# Patient Record
Sex: Male | Born: 1990 | Race: Black or African American | Hispanic: No | Marital: Single | Smoking: Never smoker
Health system: Southern US, Community
[De-identification: ages and names within clinical notes are randomized; demographics above are authoritative.]

## PROBLEM LIST (undated history)

## (undated) DIAGNOSIS — K644 Residual hemorrhoidal skin tags: Secondary | ICD-10-CM

---
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
## 2011-10-11 ENCOUNTER — Encounter (HOSPITAL_COMMUNITY): Payer: Self-pay | Admitting: *Deleted

## 2011-10-11 ENCOUNTER — Emergency Department (INDEPENDENT_AMBULATORY_CARE_PROVIDER_SITE_OTHER)
Admission: EM | Admit: 2011-10-11 | Discharge: 2011-10-11 | Disposition: A | Discharge status: Home / Self Care | Source: Home / Self Care | Attending: Family Medicine | Admitting: Family Medicine

## 2011-10-11 DIAGNOSIS — H00019 Hordeolum externum unspecified eye, unspecified eyelid: Secondary | ICD-10-CM

## 2011-10-11 MED ORDER — TOBRAMYCIN 0.3 % OP SOLN: 1.0000 [drp] | Freq: Three times a day (TID) | OPHTHALMIC | Status: DC

## 2011-10-11 NOTE — ED Provider Notes (Signed)
History     CSN: [id number]  Arrival date & time 10/11/11  1745   First MD Initiated Contact with Patient 10/11/11 1805      Chief Complaint  Patient presents with  . Eye Pain    (Consider location/radiation/quality/duration/timing/severity/associated sxs/prior treatment) Patient is a 21 y.o. male presenting with eye pain. The history is provided by the patient.  Eye Pain This is a new problem. The current episode started more than 2 days ago. The problem has been gradually worsening.    History reviewed. No pertinent past medical history.  History reviewed. No pertinent past surgical history.  History reviewed. No pertinent family history.  History  Substance Use Topics  . Smoking status: Not on file  . Smokeless tobacco: Not on file  . Alcohol Use: No      Review of Systems  Constitutional: Negative.   HENT: Negative.   Eyes: Positive for pain. Negative for photophobia, discharge, redness, itching and visual disturbance.    Allergies  Review of patient's allergies indicates no known allergies.  Home Medications   Current Outpatient Rx  Name Route Sig Dispense Refill  . TOBRAMYCIN SULFATE 0.3 % OP SOLN Right Eye Place 1 drop into the right eye 3 (three) times daily. 5 mL 0    BP 114/69  Pulse 80  Temp 99 F (37.2 C) (Oral)  Resp 18  SpO2 100%  Physical Exam  Nursing note and vitals reviewed. Constitutional: He appears well-developed and well-nourished.  HENT:  Head: Normocephalic.  Right Ear: External ear normal.  Mouth/Throat: Oropharynx is clear and moist.  Eyes: Conjunctivae normal and EOM are normal. Pupils are equal, round, and reactive to light. No foreign bodies found. Right eye exhibits hordeolum. Right eye exhibits no discharge and no exudate. No foreign body present in the right eye. No scleral icterus.    ED Course  Procedures (including critical care time)  Labs Reviewed - No data to display No results found.   1. Stye        MDM          Linna Hoff, MD 10/11/11 [phone]

## 2011-10-11 NOTE — ED Notes (Signed)
Pt reports bump on upper lid of right eye

## 2012-04-13 ENCOUNTER — Encounter (HOSPITAL_COMMUNITY): Payer: Self-pay | Admitting: Emergency Medicine

## 2012-04-13 ENCOUNTER — Emergency Department (INDEPENDENT_AMBULATORY_CARE_PROVIDER_SITE_OTHER)
Admission: EM | Admit: 2012-04-13 | Discharge: 2012-04-13 | Disposition: A | Discharge status: Home / Self Care | Payer: BC Managed Care – PPO | Source: Home / Self Care | Attending: Family Medicine | Admitting: Family Medicine

## 2012-04-13 DIAGNOSIS — K6289 Other specified diseases of anus and rectum: Secondary | ICD-10-CM

## 2012-04-13 HISTORY — DX: Residual hemorrhoidal skin tags: K64.4

## 2012-04-13 NOTE — ED Notes (Signed)
Pt c/o external hemorrhoids onset yest Hx of external hemorrhoids Pain is intermittent Needing a note for [employer] stating no strenuous activity Sx include: pain, blood in the stools  He is alert and oriented w/no signs of acute distress.

## 2012-04-13 NOTE — ED Provider Notes (Signed)
History     CSN: [id number]  Arrival date & time 04/13/12  1629   First MD Initiated Contact with Patient 04/13/12 1653      Chief Complaint  Patient presents with  . Hemorrhoids    (Consider location/radiation/quality/duration/timing/severity/associated sxs/prior treatment) Patient is a 22 y.o. male presenting with hematochezia. The history is provided by the patient.  Rectal Bleeding  The current episode started more than 2 weeks ago. The onset was gradual. The problem has been unchanged. The pain is mild. The stool is described as streaked with blood. Associated symptoms include hemorrhoids and rectal pain. Pertinent negatives include no diarrhea.    Past Medical History  Diagnosis Date  . External hemorrhoid     History reviewed. No pertinent past surgical history.  No family history on file.  History  Substance Use Topics  . Smoking status: Not on file  . Smokeless tobacco: Not on file  . Alcohol Use: No      Review of Systems  Constitutional: Negative.   Gastrointestinal: Positive for hematochezia, rectal pain and hemorrhoids. Negative for diarrhea and constipation.  Genitourinary: Negative.     Allergies  Review of patient's allergies indicates no known allergies.  Home Medications   Current Outpatient Rx  Name  Route  Sig  Dispense  Refill  . tobramycin (TOBREX) 0.3 % ophthalmic solution   Right Eye   Place 1 drop into the right eye 3 (three) times daily.   5 mL   0     BP 120/61  Pulse 74  Temp(Src) 98.3 F (36.8 C) (Oral)  Resp 14  SpO2 98%  Physical Exam  Nursing note and vitals reviewed. Constitutional: He appears well-developed and well-nourished.  Abdominal: Soft. Bowel sounds are normal. He exhibits no distension and no mass. There is no tenderness. There is no rebound and no guarding.  Genitourinary: Rectum normal and prostate normal. Rectal exam shows no external hemorrhoid, no internal hemorrhoid, no fissure, no mass and no  tenderness.  Skin: Skin is warm and dry.    ED Course  Procedures (including critical care time)  Labs Reviewed - No data to display No results found.   1. Anal or rectal pain       MDM          Linna Hoff, MD 04/13/12 1727

## 2014-08-25 ENCOUNTER — Ambulatory Visit (INDEPENDENT_AMBULATORY_CARE_PROVIDER_SITE_OTHER): Payer: BLUE CROSS/BLUE SHIELD | Admitting: Family Medicine

## 2014-08-25 VITALS — BP 110/62 | HR 80 | Temp 98.4°F | Ht 69.0 in | Wt 186.4 lb

## 2014-08-25 DIAGNOSIS — Z0000 Encounter for general adult medical examination without abnormal findings: Secondary | ICD-10-CM | POA: Diagnosis not present

## 2014-08-25 LAB — CBC
HCT: 42.4 % (ref 39.0–52.0)
HEMOGLOBIN: 14.9 g/dL (ref 13.0–17.0)
MCH: 30.5 pg (ref 26.0–34.0)
MCHC: 35.1 g/dL (ref 30.0–36.0)
MCV: 86.9 fL (ref 78.0–100.0)
MPV: 10.1 fL (ref 8.6–12.4)
Platelets: 269 10*3/uL (ref 150–400)
RBC: 4.88 MIL/uL (ref 4.22–5.81)
RDW: 13.5 % (ref 11.5–15.5)
WBC: 6.7 10*3/uL (ref 4.0–10.5)

## 2014-08-25 LAB — COMPREHENSIVE METABOLIC PANEL
ALBUMIN: 4.5 g/dL (ref 3.6–5.1)
ALT: 20 U/L (ref 9–46)
AST: 23 U/L (ref 10–40)
Alkaline Phosphatase: 51 U/L (ref 40–115)
BUN: 13 mg/dL (ref 7–25)
CO2: 25 mmol/L (ref 20–31)
Calcium: 10 mg/dL (ref 8.6–10.3)
Chloride: 103 mmol/L (ref 98–110)
Creat: 1.09 mg/dL (ref 0.60–1.35)
Glucose, Bld: 76 mg/dL (ref 65–99)
Potassium: 4.6 mmol/L (ref 3.5–5.3)
SODIUM: 140 mmol/L (ref 135–146)
TOTAL PROTEIN: 7.5 g/dL (ref 6.1–8.1)
Total Bilirubin: 0.6 mg/dL (ref 0.2–1.2)

## 2014-08-25 LAB — LIPID PANEL
CHOL/HDL RATIO: 3.6 ratio (ref <=5.0)
Cholesterol: 183 mg/dL (ref 125–200)
HDL: 51 mg/dL (ref >=40)
LDL Cholesterol: 120 mg/dL (ref <130)
Triglycerides: 61 mg/dL (ref <150)
VLDL: 12 mg/dL (ref <30)

## 2014-08-25 NOTE — Patient Instructions (Signed)
Normal physical examination.  I will let you know the results of your labs in a few days.  Return at any time if needed.  Return in one to 2 years for a follow-up physical.

## 2014-08-25 NOTE — Progress Notes (Signed)
Annual physical examination:  History: Patient is here for a physical exam. He is joining the medicines and they requested he get a physical exam. He does not have any form from them.  Past medical history: Medications: None Allergies: Seasonal, no medication allergies Past medical history: Unremarkable Past surgical history: Unremarkable  Family history: Mother is living and well. Father is living and well. He has one brother and one sister. There are also healthy. No major familial diseases.  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 He has college education in music and in criminal justice. He is planning to go back for further training when he works out things with the [place]. He is single, has had 2 sexual partners within the past year. He is active at his church where he plays the drums. He does not smoke, drink, or use drugs.  Review of systems: Constitutional: Unremarkable HEENT: Unremarkable Vascular: Unremarkable Respiratory: Unremarkable Gastrointestinal: Unremarkable Genitourinary: Unremarkable Musculoskeletal: Unremarkable Neurologic: Unremarkable Psychiatric: Unremarkable Dermatologic: Unremarkable Endocrinologic: Unremarkable   Physical exam: Well-developed well-nourished young man in no major distress, fully alert and oriented. His TMs are normal. Eyes PERRLA. Fundi benign. Throat clear. Teeth good. Neck supple without nodes or thyromegaly. No carotid bruits. Chest is clear to auscultation. Heart regular without murmurs gallops or arrhythmias. Abdomen was soft without masses or tenderness. No axillary or inguinal nodes. Normal male external genitalia with testes descended. No hernias. Digital rectal exam not done at this age. Extremities unremarkable. Skin unremarkable. Pulses in feet good.  Assessment: Normal physical examination  Plan: Advise healthy  lifestyle choices Return every 1-2 years or as needed CBC, CMP, lipids

## 2014-08-27 ENCOUNTER — Encounter: Payer: Self-pay | Admitting: Family Medicine

## 2014-12-14 ENCOUNTER — Emergency Department (INDEPENDENT_AMBULATORY_CARE_PROVIDER_SITE_OTHER)
Admission: EM | Admit: 2014-12-14 | Discharge: 2014-12-14 | Disposition: A | Discharge status: Home / Self Care | Payer: BLUE CROSS/BLUE SHIELD | Source: Home / Self Care

## 2014-12-14 ENCOUNTER — Encounter (HOSPITAL_COMMUNITY): Payer: Self-pay | Admitting: Emergency Medicine

## 2014-12-14 DIAGNOSIS — J069 Acute upper respiratory infection, unspecified: Secondary | ICD-10-CM | POA: Diagnosis not present

## 2014-12-14 DIAGNOSIS — R197 Diarrhea, unspecified: Secondary | ICD-10-CM

## 2014-12-14 MED ORDER — IBUPROFEN 800 MG PO TABS
800.0000 mg | ORAL_TABLET | Freq: Once | ORAL | Status: AC
  Administered 2014-12-14: 800 mg via ORAL

## 2014-12-14 MED ORDER — AMOXICILLIN 500 MG PO CAPS: 500.0000 mg | ORAL_CAPSULE | Freq: Three times a day (TID) | ORAL | Status: DC

## 2014-12-14 MED ORDER — IBUPROFEN 800 MG PO TABS
ORAL_TABLET | ORAL | Status: AC
  Filled 2014-12-14: qty 1

## 2014-12-14 NOTE — Discharge Instructions (Signed)
Diarrhea Diarrhea is watery poop (stool). It can make you feel weak, tired, thirsty, or give you a dry mouth (signs of dehydration). Watery poop is a sign of another problem, most often an infection. It often lasts 2-3 days. It can last longer if it is a sign of something serious. Take care of yourself as told by your doctor. HOME CARE   Drink 1 cup (8 ounces) of fluid each time you have watery poop.  Do not drink the following fluids:  Those that contain simple sugars (fructose, glucose, galactose, lactose, sucrose, maltose).  Sports drinks.  Fruit juices.  Whole milk products.  Sodas.  Drinks with caffeine (coffee, tea, soda) or alcohol.  Oral rehydration solution may be used if the doctor says it is okay. You may make your own solution. Follow this recipe:   - teaspoon table salt.   teaspoon baking soda.   teaspoon salt substitute containing potassium chloride.  1 tablespoons sugar.  1 liter (34 ounces) of water.  Avoid the following foods:  High fiber foods, such as raw fruits and vegetables.  Nuts, seeds, and whole grain breads and cereals.   Those that are sweetened with sugar alcohols (xylitol, sorbitol, mannitol).  Try eating the following foods:  Starchy foods, such as rice, toast, pasta, low-sugar cereal, oatmeal, baked potatoes, crackers, and bagels.  Bananas.  Applesauce.  Eat probiotic-rich foods, such as yogurt and milk products that are fermented.  Wash your hands well after each time you have watery poop.  Only take medicine as told by your doctor.  Take a warm bath to help lessen burning or pain from having watery poop. GET HELP RIGHT AWAY IF:   You cannot drink fluids without throwing up (vomiting).  You keep throwing up.  You have blood in your poop, or your poop looks black and tarry.  You do not pee (urinate) in 6-8 hours, or there is only a small amount of very dark pee.  You have belly (abdominal) pain that gets worse or stays  in the same spot (localizes).  You are weak, dizzy, confused, or light-headed.  You have a very bad headache.  Your watery poop gets worse or does not get better.  You have a fever or lasting symptoms for more than 2-3 days.  You have a fever and your symptoms suddenly get worse. MAKE SURE YOU:   Understand these instructions.  Will watch your condition.  Will get help right away if you are not doing well or get worse.   This information is not intended to replace advice given to you by your health care provider. Make sure you discuss any questions you have with your health care provider.   Document Released: 06/17/2007 Document Revised: 01/19/2014 Document Reviewed: 09/06/2011 Elsevier Interactive Patient Education 2016 Elsevier Inc. Upper Respiratory Infection, Adult Most upper respiratory infections (URIs) are caused by a virus. A URI affects the nose, throat, and upper air passages. The most common type of URI is often called "the common cold." HOME CARE   Take medicines only as told by your doctor.  Gargle warm saltwater or take cough drops to comfort your throat as told by your doctor.  Use a warm mist humidifier or inhale steam from a shower to increase air moisture. This may make it easier to breathe.  Drink enough fluid to keep your pee (urine) clear or pale yellow.  Eat soups and other clear broths.  Have a healthy diet.  Rest as needed.  Go back  to work when your fever is gone or your doctor says it is okay.  You may need to stay home longer to avoid giving your URI to others.  You can also wear a face mask and wash your hands often to prevent spread of the virus.  Use your inhaler more if you have asthma.  Do not use any tobacco products, including cigarettes, chewing tobacco, or electronic cigarettes. If you need help quitting, ask your doctor. GET HELP IF:  You are getting worse, not better.  Your symptoms are not helped by medicine.  You have  chills.  You are getting more short of breath.  You have brown or red mucus.  You have yellow or brown discharge from your nose.  You have pain in your face, especially when you bend forward.  You have a fever.  You have puffy (swollen) neck glands.  You have pain while swallowing.  You have white areas in the back of your throat. GET HELP RIGHT AWAY IF:   You have very bad or constant:  Headache.  Ear pain.  Pain in your forehead, behind your eyes, and over your cheekbones (sinus pain).  Chest pain.  You have long-lasting (chronic) lung disease and any of the following:  Wheezing.  Long-lasting cough.  Coughing up blood.  A change in your usual mucus.  You have a stiff neck.  You have changes in your:  Vision.  Hearing.  Thinking.  Mood. MAKE SURE YOU:   Understand these instructions.  Will watch your condition.  Will get help right away if you are not doing well or get worse.   This information is not intended to replace advice given to you by your health care provider. Make sure you discuss any questions you have with your health care provider.   Document Released: 06/17/2007 Document Revised: 05/15/2014 Document Reviewed: 04/05/2013 Elsevier Interactive Patient Education 2016 Elsevier Inc. Probiotics WHAT ARE PROBIOTICS? Probiotics are the good bacteria and yeasts that live in your body and keep you and your digestive system healthy. Probiotics also help your body's defense (immune) system and protect your body against bad bacterial growth.  Certain foods contain probiotics, such as yogurt. Probiotics can also be purchased as a supplement. As with any supplement or drug, it is important to discuss its use with your health care provider.  WHAT AFFECTS THE BALANCE OF BACTERIA IN MY BODY? The balance of bacteria in your body can be affected by:   Antibiotic medicines. Antibiotics are sometimes necessary to treat infection. Unfortunately, they may  kill good or friendly bacteria in your body as well as the bad bacteria. This may lead to stomach problems like diarrhea, gas, and cramping.  Disease. Some conditions are the result of an overgrowth of bad bacteria, yeasts, parasites, or fungi. These conditions include:   Infectious diarrhea.  Stomach and respiratory infections.  Skin infections.  Irritable bowel syndrome (IBS).  Inflammatory bowel diseases.  Ulcer due to Helicobacter pylori (H. pylori) infection.  Tooth decay and periodontal disease.  Vaginal infections. Stress and poor diet may also lower the good bacteria in your body.  WHAT TYPE OF PROBIOTIC IS RIGHT FOR ME? Probiotics are available over the counter at your local pharmacy, health food, or grocery store. They come in many different forms, combinations of strains, and dosing strengths. Some may need to be refrigerated. Always read the label for storage and usage instructions. Specific strains have been shown to be more effective for certain conditions. Ask your  health care provider what option is best for you.  WHY WOULD I NEED PROBIOTICS? There are many reasons your health care provider might recommend a probiotic supplement, including:   Diarrhea.  Constipation.  IBS.  Respiratory infections.  Yeast infections.  Acne, eczema, and other skin conditions.  Frequent urinary tract infections (UTIs). ARE THERE SIDE EFFECTS OF PROBIOTICS? Some people experience mild side effects when taking probiotics. Side effects are usually temporary and may include:   Gas.  Bloating.  Cramping. Rarely, serious side effects, such as infection or immune system changes, may occur. WHAT ELSE DO I NEED TO KNOW ABOUT PROBIOTICS?   There are many different strains of probiotics. Certain strains may be more effective depending on your condition. Probiotics are available in varying doses. Ask your health care provider which probiotic you should use and how often.   If  you are taking probiotics along with antibiotics, it is generally recommended to wait at least 2 hours between taking the antibiotic and taking the probiotic.  FOR MORE INFORMATION:  [facility] [email or web address]   This information is not intended to replace advice given to you by your health care provider. Make sure you discuss any questions you have with your health care provider.   Document Released: 07/26/2013 Document Reviewed: 07/26/2013 Elsevier Interactive Patient Education [employer]

## 2014-12-14 NOTE — ED Notes (Signed)
Pt here with GI/ URI sx's that started 3 day ago mainly in the morning with cough, nasal congestion with greenish phlegm Sore throat, vomiting and body aches,diarrhea Tried Alka-Seltzer with some relief Tolerating food/fluids

## 2014-12-14 NOTE — ED Provider Notes (Signed)
CSN: [id number]     Arrival date & time 12/14/14  1308 History   None    Chief Complaint  Patient presents with  . URI  . GI Problem   (Consider location/radiation/quality/duration/timing/severity/associated sxs/prior Treatment) HPI 24 y/o male with cold symptoms, greenish sputum.  Fever at home alka seltzer for relief.  Past Medical History  Diagnosis Date  . External hemorrhoid    History reviewed. No pertinent past surgical history. No family history on file. Social History  Substance Use Topics  . Smoking status: Never Smoker   . Smokeless tobacco: Never Used  . Alcohol Use: No    Review of Systems +'ve headache, cough, diarrhea,fever chills Allergies  Review of patient's allergies indicates no known allergies.  Home Medications   Prior to Admission medications   Not on File   Meds Ordered and Administered this Visit  Medications - No data to display  BP 140/73 mmHg  Pulse 99  Temp(Src) 98.4 F (36.9 C) (Oral)  SpO2 97% No data found.   Physical Exam  Constitutional: He is oriented to person, place, and time. He appears well-developed and well-nourished.  HENT:  Head: Normocephalic and atraumatic.  Right Ear: External ear normal.  Left Ear: External ear normal.  Mouth/Throat: Oropharynx is clear and moist.  Eyes: Conjunctivae are normal.  Neck: Normal range of motion. Neck supple.  Pulmonary/Chest: Effort normal and breath sounds normal.  Abdominal: Soft. Bowel sounds are normal.  Musculoskeletal: Normal range of motion.  Lymphadenopathy:    He has no cervical adenopathy.  Neurological: He is alert and oriented to person, place, and time.  Skin: Skin is warm and dry.  Psychiatric: His behavior is normal. Judgment and thought content normal.  Nursing note and vitals reviewed.   ED Course  Procedures (including critical care time)  Labs Review Labs Reviewed - No data to display  Imaging Review No results found.   Visual Acuity  Review  Right Eye Distance:   Left Eye Distance:   Bilateral Distance:    Right Eye Near:   Left Eye Near:    Bilateral Near:         MDM   1. Acute URI   2. Diarrhea, unspecified type     Encouraged to drink lots of fluids.  Follow up as needed Rx for amoxil  Return to work note 12/5 provided.     Tharon AquasFrank C Kimmi Acocella, PA 12/14/14 1436

## 2016-10-13 NOTE — Progress Notes (Deleted)
  No chief complaint on file.   HPI  Past Medical History:  Diagnosis Date  . External hemorrhoid     Current Outpatient Prescriptions  Medication Sig Dispense Refill  . amoxicillin (AMOXIL) 500 MG capsule Take 1 capsule (500 mg total) by mouth 3 (three) times daily. 21 capsule 0   No current facility-administered medications for this visit.     Allergies: No Known Allergies  No past surgical history on file.  Social History   Social History  . Marital status: Single    Spouse name: N/A  . Number of children: N/A  . Years of education: N/A   Social History Main Topics  . Smoking status: Never Smoker  . Smokeless tobacco: Never Used  . Alcohol use No  . Drug use: No  . Sexual activity: No   Other Topics Concern  . Not on file   Social History Narrative  . No narrative on file    ROS  Objective: There were no vitals filed for this visit.  Physical Exam  Assessment and Plan There are no diagnoses linked to this encounter.   Malichi Palardy P [employer]

## 2016-10-14 ENCOUNTER — Ambulatory Visit: Payer: BLUE CROSS/BLUE SHIELD | Admitting: Family Medicine

## 2016-10-15 ENCOUNTER — Ambulatory Visit (INDEPENDENT_AMBULATORY_CARE_PROVIDER_SITE_OTHER): Payer: 59 | Admitting: Family Medicine

## 2016-10-15 ENCOUNTER — Encounter: Payer: Self-pay | Admitting: Family Medicine

## 2016-10-15 VITALS — BP 108/70 | HR 107 | Temp 98.4°F | Resp 17 | Ht 69.0 in | Wt 204.0 lb

## 2016-10-15 DIAGNOSIS — L738 Other specified follicular disorders: Secondary | ICD-10-CM | POA: Diagnosis not present

## 2016-10-15 MED ORDER — CLINDAMYCIN PHOSPHATE 1 % EX LOTN: TOPICAL_LOTION | Freq: Two times a day (BID) | CUTANEOUS | 0 refills | Status: AC

## 2016-10-15 NOTE — Progress Notes (Signed)
  Chief Complaint  Patient presents with  . letter for law enforcement    re: hair on face can be 1/4 in on face allowing pt to due duties, ie wear gas mask    HPI   Pt has a history of folliculitis barbae He cannot shave his beard closely He now works in Patent examiner at the [employer] It is required that he wears a low profile and a close shaven food    Past Medical History:  Diagnosis Date  . External hemorrhoid     Current Outpatient Prescriptions  Medication Sig Dispense Refill  . clindamycin (CLEOCIN-T) 1 % lotion Apply topically 2 (two) times daily. 60 mL 0   No current facility-administered medications for this visit.     Allergies: Not on File  No past surgical history on file.  Social History   Social History  . Marital status: Single    Spouse name: N/A  . Number of children: N/A  . Years of education: N/A   Social History Main Topics  . Smoking status: Never Smoker  . Smokeless tobacco: Never Used  . Alcohol use No  . Drug use: No  . Sexual activity: No   Other Topics Concern  . None   Social History Narrative  . None    ROS  Objective: Vitals:   10/15/16 1604  BP: 108/70  Pulse: (!) 107  Resp: 17  Temp: 98.4 F (36.9 C)  TempSrc: Oral  SpO2: 97%  Weight: 204 lb (92.5 kg)  Height:  (1.753 m)    Physical Exam  Constitutional: He appears well-developed and well-nourished.  HENT:  Head: Normocephalic and atraumatic.  Pulmonary/Chest: Effort normal.  Skin:  Coarse coily hair, no active lesion, no comedones    Assessment and Plan Esgar was seen today for letter for law enforcement.  Diagnoses and all orders for this visit:  Folliculitis barbae  Other orders -     Cancel: Tdap vaccine greater than or equal to 7yo IM -     Cancel: Flu Vaccine QUAD 36+ mos IM -     clindamycin (CLEOCIN-T) 1 % lotion; Apply topically 2 (two) times daily.   Pt declined vaccines   Gave work note indicating that he cannot do  a close shave Gave cleocin-t for breakouts  Ridhaan Dreibelbis A [employer]

## 2016-10-15 NOTE — Patient Instructions (Addendum)
   IF you received an x-ray today, you will receive an invoice from [facility]. Please contact [facility] at [phone] with questions or concerns regarding your invoice.   IF you received labwork today, you will receive an invoice from [facility]. Please contact [facility] at [phone] with questions or concerns regarding your invoice.   Our billing staff will not be able to assist you with questions regarding bills from these companies.  You will be contacted with the lab results as soon as they are available. The fastest way to get your results is to activate your My Chart account. Instructions are located on the last page of this paperwork. If you have not heard from us regarding the results in 2 weeks, please contact this office.     Folliculitis Folliculitis is inflammation of the hair follicles. Folliculitis most commonly occurs on the scalp, thighs, legs, back, and buttocks. However, it can occur anywhere on the body. What are the causes? This condition may be caused by:  A bacterial infection (common).  A fungal infection.  A viral infection.  Coming into contact with certain chemicals, especially oils and tars.  Shaving or waxing.  Applying greasy ointments or creams to your skin often.  Long-lasting folliculitis and folliculitis that keeps coming back can be caused by bacteria that live in the nostrils. What increases the risk? This condition is more likely to develop in people with:  A weakened immune system.  Diabetes.  Obesity.  What are the signs or symptoms? Symptoms of this condition include:  Redness.  Soreness.  Swelling.  Itching.  Small white or yellow, pus-filled, itchy spots (pustules) that appear over a reddened area. If there is an infection that goes deep into the follicle, these may develop into a boil (furuncle).  A group of closely packed boils (carbuncle). These tend to form in hairy, sweaty areas of the  body.  How is this diagnosed? This condition is diagnosed with a skin exam. To find what is causing the condition, your health care provider may take a sample of one of the pustules or boils for testing. How is this treated? This condition may be treated by:  Applying warm compresses to the affected areas.  Taking an antibiotic medicine or applying an antibiotic medicine to the skin.  Applying or bathing with an antiseptic solution.  Taking an over-the-counter medicine to help with itching.  Having a procedure to drain any pustules or boils. This may be done if a pustule or boil contains a lot of pus or fluid.  Laser hair removal. This may be done to treat long-lasting folliculitis.  Follow these instructions at home:  If directed, apply heat to the affected area as often as told by your health care provider. Use the heat source that your health care provider recommends, such as a moist heat pack or a heating pad. ? Place a towel between your skin and the heat source. ? Leave the heat on for 20-30 minutes. ? Remove the heat if your skin turns bright red. This is especially important if you are unable to feel pain, heat, or cold. You may have a greater risk of getting burned.  If you were prescribed an antibiotic medicine, use it as told by your health care provider. Do not stop using the antibiotic even if you start to feel better.  Take over-the-counter and prescription medicines only as told by your health care provider.  Do not shave irritated skin.  Keep all follow-up visits   as told by your health care provider. This is important. Get help right away if:  You have more redness, swelling, or pain in the affected area.  Red streaks are spreading from the affected area.  You have a fever. This information is not intended to replace advice given to you by your health care provider. Make sure you discuss any questions you have with your health care provider. Document Released:  03/09/2001 Document Revised: 07/19/2015 Document Reviewed: 10/19/2014 Elsevier Interactive Patient Education  2018 [employer]  

## 2020-03-18 ENCOUNTER — Encounter: Payer: Self-pay | Admitting: Emergency Medicine

## 2020-03-18 ENCOUNTER — Ambulatory Visit
Admission: EM | Admit: 2020-03-18 | Discharge: 2020-03-18 | Disposition: A | Discharge status: Home / Self Care | Payer: 59

## 2020-03-18 ENCOUNTER — Other Ambulatory Visit: Payer: Self-pay

## 2020-03-18 DIAGNOSIS — M25561 Pain in right knee: Secondary | ICD-10-CM

## 2020-03-18 MED ORDER — IBUPROFEN 600 MG PO TABS: 600.0000 mg | ORAL_TABLET | Freq: Three times a day (TID) | ORAL | 0 refills | Status: AC | PRN

## 2020-03-18 NOTE — ED Provider Notes (Signed)
Roberto Wright    CSN: [id number] Arrival date & time: 03/18/20  1202      History   Chief Complaint Chief Complaint  Patient presents with  . Knee Pain    right    HPI Roberto Wright is a 30 y.o. male.   Patient is a 30 year old male who presents today with right knee pain, swelling.  This started Saturday after playing football and doing some drills.  Reporting doing a lot of twisting motions with the knee.  Did not specifically fall on the knee.  The pain is more medial and distal to the patella.  He has been able to ambulate.  Denies any sensation that the knee will give out on him. No clicking or popping.   Has been icing, elevating and using ibuprofen.     Past Medical History:  Diagnosis Date  . External hemorrhoid     There are no problems to display for this patient.   History reviewed. No pertinent surgical history.     Home Medications    Prior to Admission medications   Medication Sig Start Date End Date Taking? Authorizing Provider  Cholecalciferol 25 MCG (1000 UT) tablet Take 1 tablet by mouth daily. 01/15/20  Yes [provider]  ibuprofen (ADVIL) 600 MG tablet Take 1 tablet (600 mg total) by mouth every 8 (eight) hours as needed for moderate pain. 03/18/20  Yes Teiana Hajduk A, NP  sertraline (ZOLOFT) 100 MG tablet TAKE ONE TABLET BY MOUTH DAILY FOR MENTAL HEALTH 01/15/20  Yes [provider]  clindamycin (CLEOCIN-T) 1 % lotion Apply topically 2 (two) times daily. 10/15/16   Doristine Bosworth, MD    Family History Family History  Problem Relation Age of Onset  . Healthy Mother   . Healthy Father     Social History Social History   Tobacco Use  . Smoking status: Never Smoker  . Smokeless tobacco: Never Used  Substance Use Topics  . Alcohol use: No    Alcohol/week: 0.0 standard drinks  . Drug use: No     Allergies   Patient has no allergy information on record.   Review of Systems Review of Systems   Physical  Exam Triage Vital Signs ED Triage Vitals  Enc Vitals Group     BP 03/18/20 1229 106/65     Pulse Rate 03/18/20 1229 77     Resp 03/18/20 1229 18     Temp 03/18/20 1229 98 F (36.7 C)     Temp Source 03/18/20 1229 Oral     SpO2 03/18/20 1229 97 %     Weight --      Height --      Head Circumference --      Peak Flow --      Pain Score 03/18/20 1231 6     Pain Loc --      Pain Edu? --      Excl. in GC? --    No data found.  Updated Vital Signs BP 106/65 (BP Location: Left Arm)   Pulse 77   Temp 98 F (36.7 C) (Oral)   Resp 18   SpO2 97%   Visual Acuity Right Eye Distance:   Left Eye Distance:   Bilateral Distance:    Right Eye Near:   Left Eye Near:    Bilateral Near:     Physical Exam Vitals and nursing note reviewed.  Constitutional:      Appearance: Normal appearance.  HENT:  Head: Normocephalic and atraumatic.     Nose: Nose normal.  Eyes:     Conjunctiva/sclera: Conjunctivae normal.  Pulmonary:     Effort: Pulmonary effort is normal.  Abdominal:     Palpations: Abdomen is soft.     Tenderness: There is no abdominal tenderness.  Musculoskeletal:     Cervical back: Normal range of motion.     Right knee: Decreased range of motion. Tenderness present over the medial joint line, ACL and patellar tendon. LCL laxity and MCL laxity present.  Skin:    General: Skin is warm and dry.  Neurological:     Mental Status: He is alert.  Psychiatric:        Mood and Affect: Mood normal.      UC Treatments / Results  Labs (all labs ordered are listed, but only abnormal results are displayed) Labs Reviewed - No data to display  EKG   Radiology No results found.  Procedures Procedures (including critical care time)  Medications Ordered in UC Medications - No data to display  Initial Impression / Assessment and Plan / UC Course  I have reviewed the triage vital signs and the nursing notes.  Pertinent labs & imaging results that were available  during my care of the patient were reviewed by me and considered in my medical decision making (see chart for details).     Acute knee pain of the right Some concern for MCL injury No specific laxity on exam, mild swelling. Will place and hinged knee brace here today.  We will have him rest, ice, elevate and ibuprofen as needed. Work note given to stay off the knee for the next couple days Recommend follow-up with orthopedics for any continued issues Final Clinical Impressions(s) / UC Diagnoses   Final diagnoses:  Acute pain of right knee     Discharge Instructions     I believe that this is an MCL injury or meniscus injury.  I am placing you in a knee hinge brace here today.  Rest, Ice the knee and elevate. Stay off the leg as much as possible.  Please see orthopedics for follow up as needed.  Ibuprofen as needed.     ED Prescriptions    Medication Sig Dispense Auth. Provider   ibuprofen (ADVIL) 600 MG tablet Take 1 tablet (600 mg total) by mouth every 8 (eight) hours as needed for moderate pain. 30 tablet Dahlia Byes A, NP     PDMP not reviewed this encounter.   Dahlia Byes A, NP 03/18/20 1300

## 2020-03-18 NOTE — ED Triage Notes (Signed)
Pt presents today with c/o of right knee pain, denies specific injury, but does report pain after playing football and combatives. Pain 7/10. Pt is ambulatory.

## 2020-03-18 NOTE — Discharge Instructions (Addendum)
I believe that this is an MCL injury or meniscus injury.  I am placing you in a knee hinge brace here today.  Rest, Ice the knee and elevate. Stay off the leg as much as possible.  Please see orthopedics for follow up as needed.  Ibuprofen as needed.
